# Patient Record
Sex: Male | Born: 1955 | Race: White | Hispanic: No | Marital: Married | Smoking: Never smoker
Health system: Southern US, Community
[De-identification: ages and names within clinical notes are randomized; demographics above are authoritative.]

## PROBLEM LIST (undated history)

## (undated) DIAGNOSIS — I10 Essential (primary) hypertension: Secondary | ICD-10-CM

---

## 2012-12-10 DIAGNOSIS — I10 Essential (primary) hypertension: Secondary | ICD-10-CM | POA: Insufficient documentation

## 2015-04-20 DIAGNOSIS — S83411A Sprain of medial collateral ligament of right knee, initial encounter: Secondary | ICD-10-CM | POA: Insufficient documentation

## 2020-01-12 ENCOUNTER — Ambulatory Visit: Payer: BC Managed Care – PPO | Admitting: Podiatry

## 2020-01-14 ENCOUNTER — Ambulatory Visit
Admission: EM | Admit: 2020-01-14 | Discharge: 2020-01-14 | Disposition: A | Discharge status: Home / Self Care | Payer: BC Managed Care – PPO | Attending: Internal Medicine | Admitting: Internal Medicine

## 2020-01-14 ENCOUNTER — Encounter: Payer: Self-pay | Admitting: Emergency Medicine

## 2020-01-14 ENCOUNTER — Ambulatory Visit (INDEPENDENT_AMBULATORY_CARE_PROVIDER_SITE_OTHER)
Admit: 2020-01-14 | Discharge: 2020-01-14 | Disposition: A | Discharge status: Home / Self Care | Payer: BC Managed Care – PPO | Attending: Internal Medicine | Admitting: Internal Medicine

## 2020-01-14 ENCOUNTER — Other Ambulatory Visit: Payer: Self-pay

## 2020-01-14 ENCOUNTER — Ambulatory Visit (INDEPENDENT_AMBULATORY_CARE_PROVIDER_SITE_OTHER): Payer: BC Managed Care – PPO

## 2020-01-14 DIAGNOSIS — K5730 Diverticulosis of large intestine without perforation or abscess without bleeding: Secondary | ICD-10-CM | POA: Diagnosis not present

## 2020-01-14 DIAGNOSIS — Z79899 Other long term (current) drug therapy: Secondary | ICD-10-CM | POA: Diagnosis not present

## 2020-01-14 DIAGNOSIS — Z7982 Long term (current) use of aspirin: Secondary | ICD-10-CM | POA: Insufficient documentation

## 2020-01-14 DIAGNOSIS — I10 Essential (primary) hypertension: Secondary | ICD-10-CM | POA: Diagnosis not present

## 2020-01-14 DIAGNOSIS — K5900 Constipation, unspecified: Secondary | ICD-10-CM | POA: Diagnosis not present

## 2020-01-14 DIAGNOSIS — I700 Atherosclerosis of aorta: Secondary | ICD-10-CM | POA: Insufficient documentation

## 2020-01-14 DIAGNOSIS — K56609 Unspecified intestinal obstruction, unspecified as to partial versus complete obstruction: Secondary | ICD-10-CM | POA: Diagnosis not present

## 2020-01-14 DIAGNOSIS — R109 Unspecified abdominal pain: Secondary | ICD-10-CM | POA: Diagnosis not present

## 2020-01-14 DIAGNOSIS — K5909 Other constipation: Secondary | ICD-10-CM | POA: Diagnosis present

## 2020-01-14 HISTORY — DX: Essential (primary) hypertension: I10

## 2020-01-14 LAB — CBC WITH DIFFERENTIAL/PLATELET
Abs Immature Granulocytes: 0.06 10*3/uL (ref 0.00–0.07)
Basophils Absolute: 0.1 10*3/uL (ref 0.0–0.1)
Basophils Relative: 1 %
Eosinophils Absolute: 0.1 10*3/uL (ref 0.0–0.5)
Eosinophils Relative: 1 %
HCT: 40 % (ref 39.0–52.0)
Hemoglobin: 13.7 g/dL (ref 13.0–17.0)
Immature Granulocytes: 1 %
Lymphocytes Relative: 16 %
Lymphs Abs: 1.9 10*3/uL (ref 0.7–4.0)
MCH: 30.4 pg (ref 26.0–34.0)
MCHC: 34.3 g/dL (ref 30.0–36.0)
MCV: 88.7 fL (ref 80.0–100.0)
Monocytes Absolute: 0.5 10*3/uL (ref 0.1–1.0)
Monocytes Relative: 4 %
Neutro Abs: 9 10*3/uL — ABNORMAL HIGH (ref 1.7–7.7)
Neutrophils Relative %: 77 %
Platelets: 303 10*3/uL (ref 150–400)
RBC: 4.51 MIL/uL (ref 4.22–5.81)
RDW: 12 % (ref 11.5–15.5)
WBC: 11.6 10*3/uL — ABNORMAL HIGH (ref 4.0–10.5)
nRBC: 0 % (ref 0.0–0.2)

## 2020-01-14 LAB — AMYLASE: Amylase: 28 U/L (ref 28–100)

## 2020-01-14 LAB — COMPREHENSIVE METABOLIC PANEL
ALT: 40 U/L (ref 0–44)
AST: 29 U/L (ref 15–41)
Albumin: 5 g/dL (ref 3.5–5.0)
Alkaline Phosphatase: 52 U/L (ref 38–126)
Anion gap: 11 (ref 5–15)
BUN: 35 mg/dL — ABNORMAL HIGH (ref 8–23)
CO2: 28 mmol/L (ref 22–32)
Calcium: 10.1 mg/dL (ref 8.9–10.3)
Chloride: 99 mmol/L (ref 98–111)
Creatinine, Ser: 1.32 mg/dL — ABNORMAL HIGH (ref 0.61–1.24)
GFR calc Af Amer: >60 mL/min (ref >60)
GFR calc non Af Amer: 57 mL/min — ABNORMAL LOW (ref >60)
Glucose, Bld: 107 mg/dL — ABNORMAL HIGH (ref 70–99)
Potassium: 4.1 mmol/L (ref 3.5–5.1)
Sodium: 138 mmol/L (ref 135–145)
Total Bilirubin: 1.6 mg/dL — ABNORMAL HIGH (ref 0.3–1.2)
Total Protein: 7.7 g/dL (ref 6.5–8.1)

## 2020-01-14 LAB — LIPASE, BLOOD: Lipase: 32 U/L (ref 11–51)

## 2020-01-14 NOTE — ED Provider Notes (Signed)
[facility]    CSN: [id number] Arrival date & time: 01/14/20  1727      History   Chief Complaint Chief Complaint  Patient presents with  . Abdominal Pain  . Constipation    HPI Cole Harrison is a 64 y.o. male who presents with chronic constipation x 1 y. He took Miralax yesterday and did not have a BM. Today he took some Metamucil and still did not have a BM. Today his wife made him a sandwich and as he started eating felt fool and immediately started having epigastric pain and got sweaty and felt burping and continues burping as if he just finished eating his sandwich, but is not feeling anything bitter coming up his throat. He took 5 TUMS with no relief. Finally he has small BM this afternoon. Denies blood in his stool or black stool   Past Medical History:  Diagnosis Date  . Hypertension     Patient Active Problem List   Diagnosis Date Noted  . Sprain of medial collateral ligament of right knee 04/20/2015  . HTN (hypertension) 12/10/2012    History reviewed. No pertinent surgical history.     Home Medications    Prior to Admission medications   Medication Sig Start Date End Date Taking? Authorizing Provider  aspirin 81 MG EC tablet Take by mouth.   Yes [provider]  hydrochlorothiazide (HYDRODIURIL) 12.5 MG tablet Take 1 tablet by mouth every morning. 05/28/19  Yes [provider]  omeprazole (PRILOSEC) 40 MG capsule Take by mouth. 06/02/18 01/14/20  [provider]    Family History Family History  Problem Relation Age of Onset  . Stroke Mother   . Cancer Mother   . Cancer Father     Social History Social History   Tobacco Use  . Smoking status: Never Smoker  . Smokeless tobacco: Never Used  Vaping Use  . Vaping Use: Never used  Substance Use Topics  . Alcohol use: Never  . Drug use: Never     Allergies   Patient has no known allergies.   Review of Systems Review of Systems   Physical Exam Triage  Vital Signs ED Triage Vitals  Enc Vitals Group     BP 01/14/20 1759 (!) 166/91     Pulse Rate 01/14/20 1759 62     Resp 01/14/20 1759 18     Temp 01/14/20 1759 98 F (36.7 C)     Temp Source 01/14/20 1759 Oral     SpO2 01/14/20 1759 98 %     Weight 01/14/20 1757 180 lb (81.6 kg)     Height 01/14/20 1757 5\' 6"  (1.676 m)     Head Circumference --      Peak Flow --      Pain Score 01/14/20 1757 5     Pain Loc --      Pain Edu? --      Excl. in GC? --    No data found.  Updated Vital Signs BP (!) 166/91 (BP Location: Right Arm)   Pulse 62   Temp 98 F (36.7 C) (Oral)   Resp 18   Ht 5\' 6"  (1.676 m)   Wt 180 lb (81.6 kg)   SpO2 98%   BMI 29.05 kg/m   Visual Acuity Right Eye Distance:   Left Eye Distance:   Bilateral Distance:    Right Eye Near:   Left Eye Near:    Bilateral Near:     Physical Exam Vitals  and nursing note reviewed.  Constitutional:      General: He is not in acute distress.    Appearance: He is not toxic-appearing.  HENT:     Head: Normocephalic.  Eyes:     General: No scleral icterus.    Extraocular Movements: Extraocular movements intact.  Cardiovascular:     Rate and Rhythm: Normal rate and regular rhythm.     Heart sounds: No murmur heard.   Pulmonary:     Effort: Pulmonary effort is normal.     Breath sounds: Normal breath sounds.  Abdominal:     General: Abdomen is flat. Bowel sounds are decreased. There is no distension or abdominal bruit.     Palpations: Abdomen is soft. There is no hepatomegaly, splenomegaly or mass.     Tenderness: There is abdominal tenderness in the epigastric area. There is no guarding or rebound.  Skin:    General: Skin is warm and dry.     Coloration: Skin is not jaundiced.     Findings: No rash.  Neurological:     General: No focal deficit present.     Mental Status: He is alert and oriented to person, place, and time.  Psychiatric:        Mood and Affect: Mood normal.        Behavior: Behavior normal.        UC Treatments / Results  Labs (all labs ordered are listed, but only abnormal results are displayed) Labs Reviewed  CBC WITH DIFFERENTIAL/PLATELET - Abnormal; Notable for the following components:      Result Value   WBC 11.6 (*)    Neutro Abs 9.0 (*)    All other components within normal limits  COMPREHENSIVE METABOLIC PANEL - Abnormal; Notable for the following components:   Glucose, Bld 107 (*)    BUN 35 (*)    Creatinine, Ser 1.32 (*)    Total Bilirubin 1.6 (*)    GFR calc non Af Amer 57 (*)    All other components within normal limits  AMYLASE  LIPASE, BLOOD    EKG   Radiology CT ABDOMEN PELVIS WO CONTRAST  Result Date: 01/14/2020 CLINICAL DATA:  Epigastric pain. Leukocytosis. Acute abdominal pain, onset after eating a sandwich. EXAM: CT ABDOMEN AND PELVIS WITHOUT CONTRAST TECHNIQUE: Multidetector CT imaging of the abdomen and pelvis was performed following the standard protocol without IV contrast. COMPARISON:  Radiograph earlier today. FINDINGS: Lower chest: No acute abnormality. No acute airspace disease or pleural effusion. Hepatobiliary: No focal hepatic abnormality on noncontrast exam. Decompressed gallbladder. No calcified gallstone. No pericholecystic inflammation. No evidence of biliary dilatation. Pancreas: No ductal dilatation or inflammation. Spleen: Normal in size without focal abnormality. Adrenals/Urinary Tract: Normal adrenal glands. No hydronephrosis. There is mild symmetric ill-defined perinephric edema. Water density lesion in the upper left kidney measures 18 mm and is likely cyst, but incompletely characterized on noncontrast exam. No renal calculi. Decompressed ureters without ureteral calculi. Mild bladder distention without wall thickening. Stomach/Bowel: Fluid distended stomach. Normal positioning of the duodenum and ligament of Treitz. Dilated fluid-filled proximal small bowel with fecalization of small bowel contents in the central abdomen.  Transition from dilated to nondilated in the left mid abdomen, series 5, image 45 and series 2, image 44. There is no evidence of obstructing mass or cause for obstruction. More distal small bowel is decompressed. The appendix is normal. Moderate colonic stool burden. No colonic wall thickening. Diverticulosis involving the distal descending and sigmoid colon. No diverticulitis. Vascular/Lymphatic: Normal  caliber abdominal aorta with minimal aortic atherosclerosis. Scattered retroperitoneal lymph nodes not enlarged by size criteria. There is no mesenteric adenopathy. No upper abdominal adenopathy. Reproductive: Prostatic calcifications. Other: Minimal mesenteric edema involving the fluid-filled small bowel in the upper abdomen. No significant ascites. There is no free air or perforation. No abscess. Tiny fat containing umbilical hernia. Musculoskeletal: There are no acute or suspicious osseous abnormalities. There is facet hypertrophy in the lumbar spine. IMPRESSION: 1. Small bowel obstruction with transition point in the left mid abdomen. No evidence of obstructing mass or cause for obstruction. 2. Colonic diverticulosis without diverticulitis. Aortic Atherosclerosis (ICD10-I70.0). Electronically Signed   By: Narda Rutherford M.D.   On: 01/14/2020 19:42   DG Abd 2 Views  Result Date: 01/14/2020 CLINICAL DATA:  Constipation and abdominal pain EXAM: ABDOMEN - 2 VIEW COMPARISON:  None. FINDINGS: Lung bases are clear. No free air beneath the diaphragm. Suspicion of fluid-filled dilated small bowel in the left abdomen measuring up to 4 cm. Moderate stool in the right and transverse colon. Paucity of distal bowel gas. IMPRESSION: Suspicion for fluid-filled dilated small bowel in the left abdomen, raising concern for a bowel obstruction. Further evaluation with CT is recommended. There is moderate stool in the colon. Electronically Signed   By: Jasmine Pang M.D.   On: 01/14/2020 18:46    Procedures Procedures  (including critical care time)  Medications Ordered in UC Medications - No data to display  Initial Impression / Assessment and Plan / UC Course  I have reviewed the triage vital signs and the nursing notes. His CBC is silty elevated, CMP shows slight elevation of creatinine, mild elevation of glucose, BUN and total bili, Amylase and lipase are pending.  He was sent to [facility] ER.  Pertinent labs & imaging results that were available during my care of the patient were reviewed by me and considered in my medical decision making (see chart for details).  Final Clinical Impressions(s) / UC Diagnoses   Final diagnoses:  Small bowel obstruction [facility])     Discharge Instructions     The cat scan is showing the following and you need to go to the ER: IMPRESSION: 1. Small bowel obstruction with transition point in the left mid abdomen. No evidence of obstructing mass or cause for obstruction. 2. Colonic diverticulosis without diverticulitis.    ED Prescriptions    None     PDMP not reviewed this encounter.   Garey Ham, PA-C 01/14/20 2248

## 2020-01-14 NOTE — Discharge Instructions (Addendum)
The cat scan is showing the following and you need to go to the ER: IMPRESSION: 1. Small bowel obstruction with transition point in the left mid abdomen. No evidence of obstructing mass or cause for obstruction. 2. Colonic diverticulosis without diverticulitis.

## 2020-01-14 NOTE — ED Triage Notes (Signed)
Patient states he has been constipated and has had ongoing problems for about a year. He states he took some Miralax yesterday and had no results. He took some Metamucil today with no relief. He stated he started to eat a sandwich today and felt full immediately and started having reflux. He took 5 TUMS with no relief. He is c/o abdominal pain. Last BM was this afternoon. He reports it was small.

## 2020-01-18 ENCOUNTER — Telehealth: Payer: Self-pay | Admitting: Emergency Medicine

## 2020-01-18 NOTE — Telephone Encounter (Signed)
Authorization for CT done on 01/14/20 was authorized. Berkley Harvey #[id number]

## 2021-12-26 IMAGING — CT CT ABD-PELV W/O CM
1 of 2 series · 14 of 32 positions shown, 18 images · non-contrast
Comparison: Radiograph earlier today.

CLINICAL DATA: Epigastric pain. Leukocytosis. Acute abdominal pain,
onset after eating a sandwich.

EXAM:
CT ABDOMEN AND PELVIS WITHOUT CONTRAST
TECHNIQUE: Multidetector CT imaging of the abdomen and pelvis was performed
following the standard protocol without IV contrast.

[Series 2: axial st · axial · 0.80mm/px · z∈[-890,-470]mm · 14 of 92 slices shown, 18 images]
[im 4/92  soft-tissue]
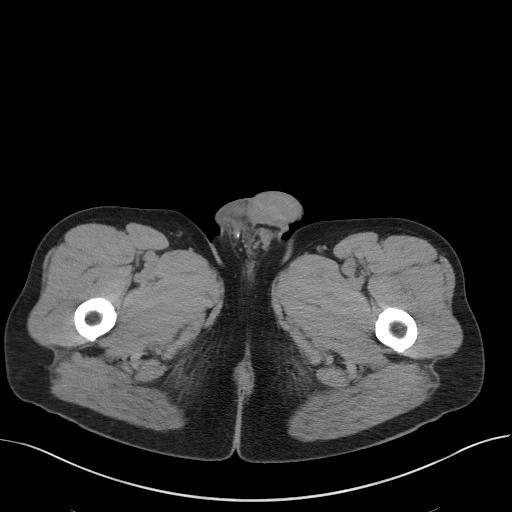
[im 4/92  bone]
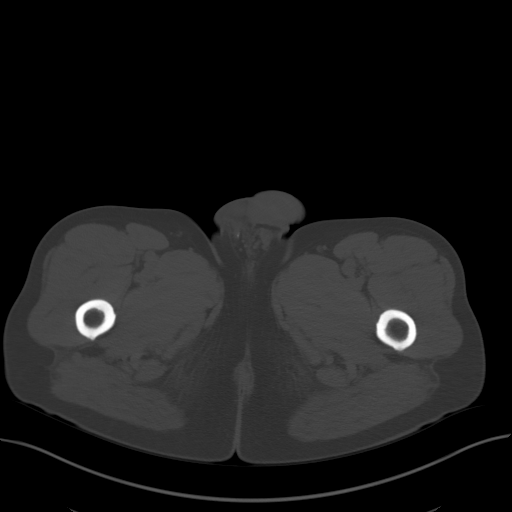
[im 12/92  soft-tissue]
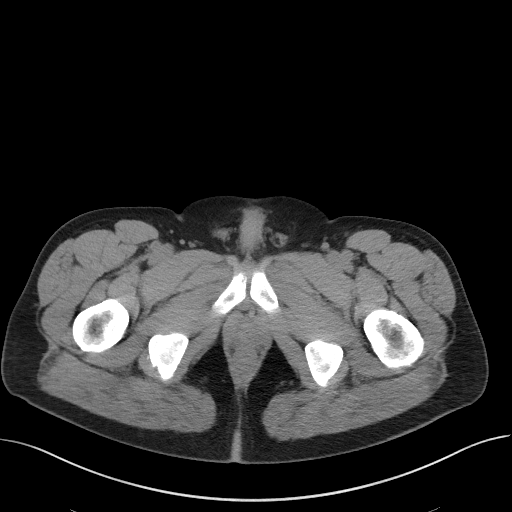
[im 19/92  soft-tissue]
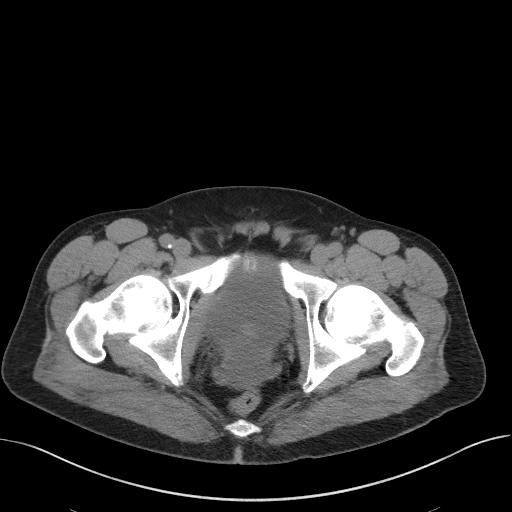
[im 27/92  soft-tissue]
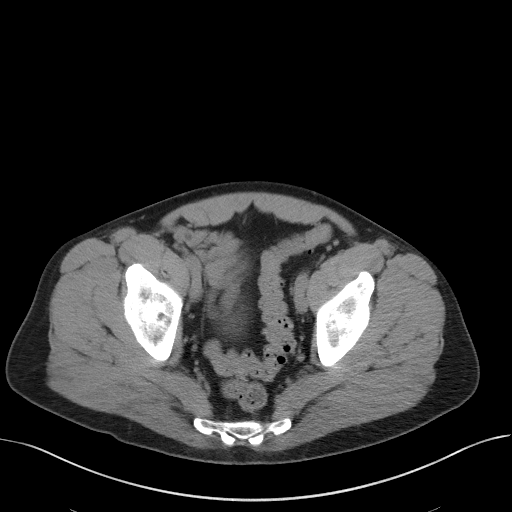
[im 35/92  soft-tissue]
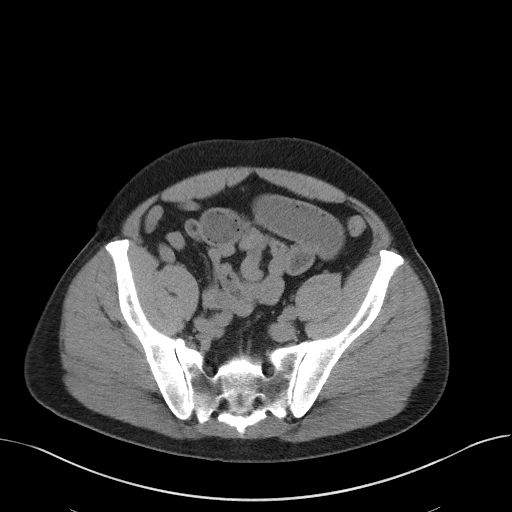
[im 42/92  soft-tissue]
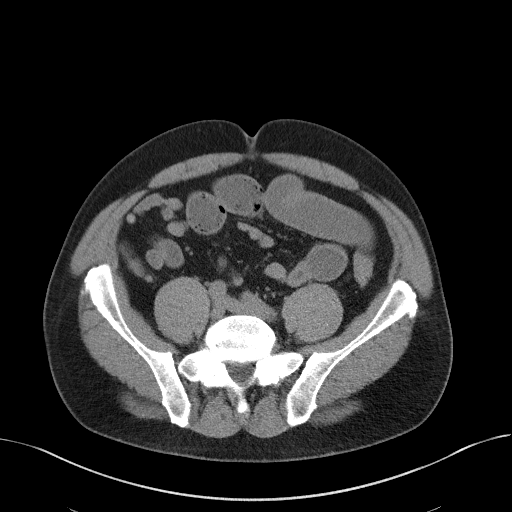
[im 50/92  soft-tissue]
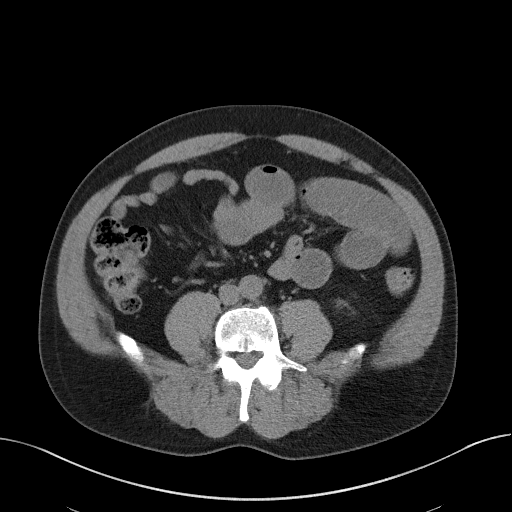
[im 57/92  soft-tissue]
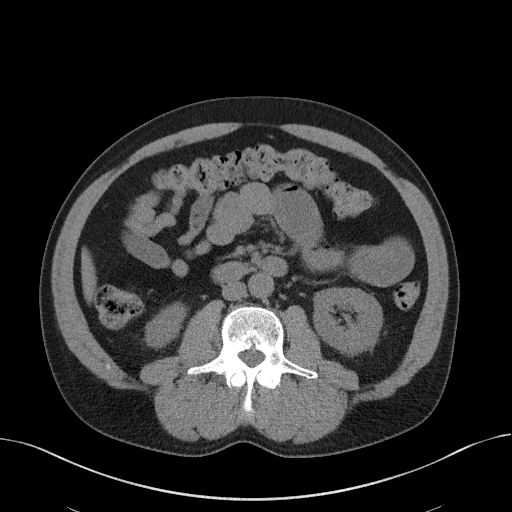
[im 65/92  soft-tissue]
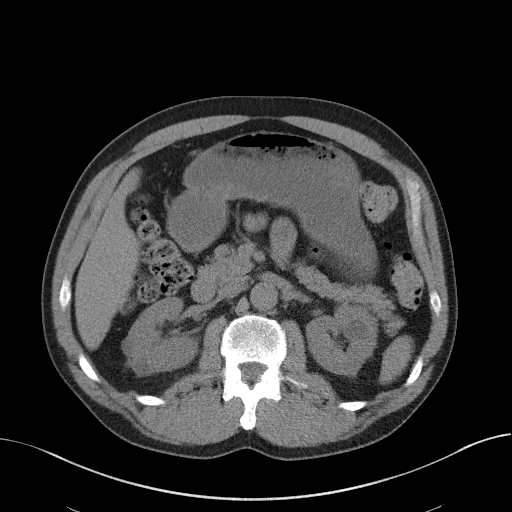
[im 65/92  bone]
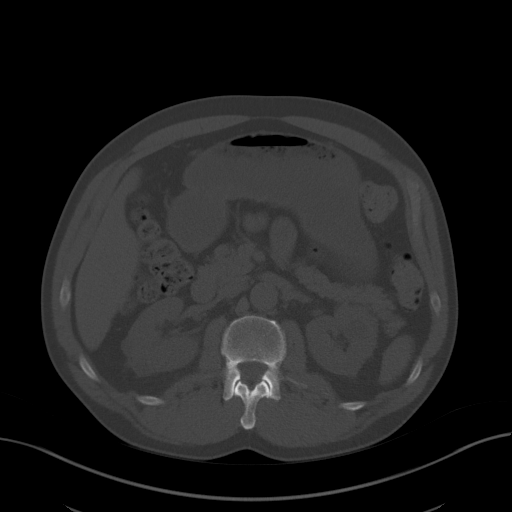
[im 73/92  soft-tissue]
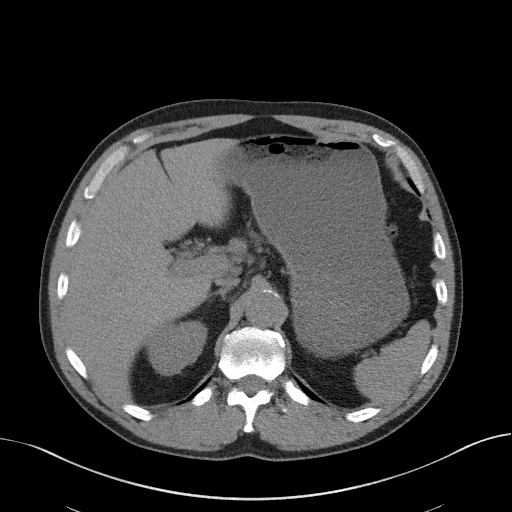
[im 76/92  lung]
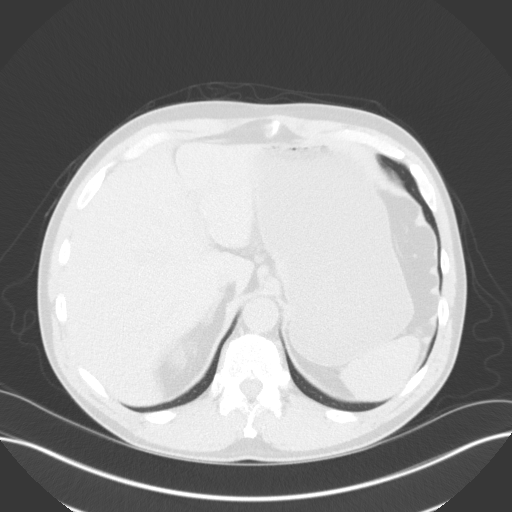
[im 80/92  soft-tissue]
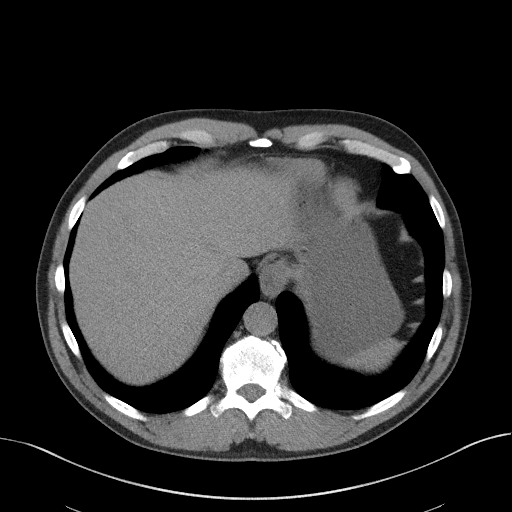
[im 80/92  lung]
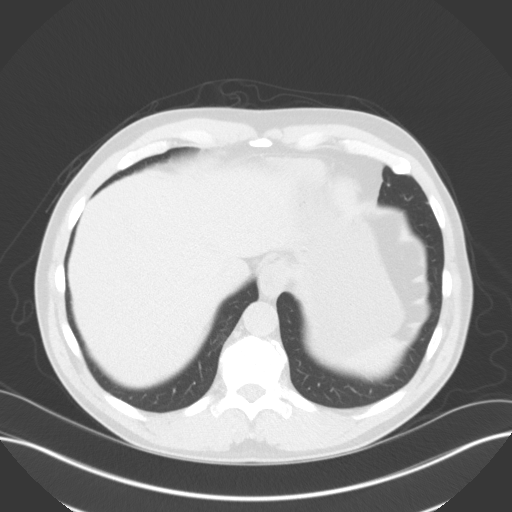
[im 84/92  lung]
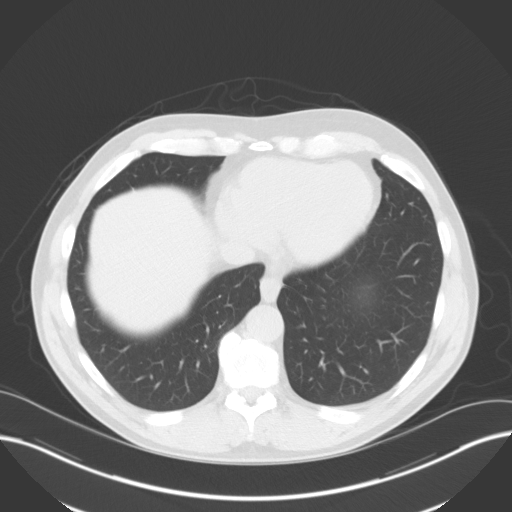
[im 88/92  soft-tissue]
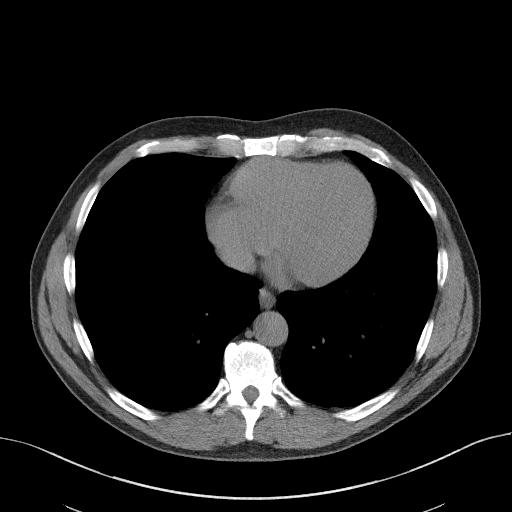
[im 88/92  lung]
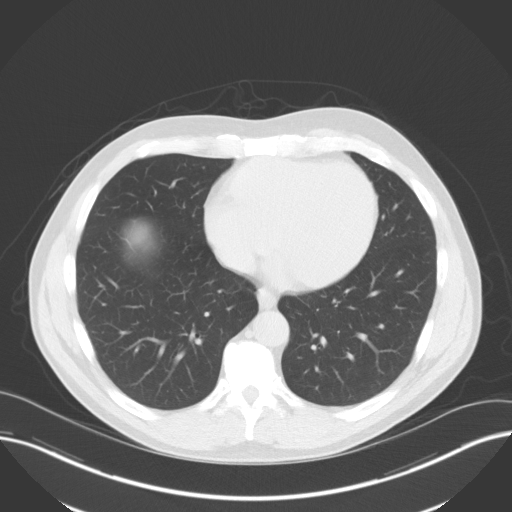

[14 of 32 positions shown; findings below may reference images not displayed]

FINDINGS: Lower chest: No acute abnormality. No acute airspace disease or
pleural effusion.

Hepatobiliary: No focal hepatic abnormality on noncontrast exam.
Decompressed gallbladder. No calcified gallstone. No pericholecystic
inflammation. No evidence of biliary dilatation.

Pancreas: No ductal dilatation or inflammation.

Spleen: Normal in size without focal abnormality.

Adrenals/Urinary Tract: Normal adrenal glands. No hydronephrosis.
There is mild symmetric ill-defined perinephric edema. Water density
lesion in the upper left kidney measures 18 mm and is likely cyst,
but incompletely characterized on noncontrast exam. No renal
calculi. Decompressed ureters without ureteral calculi. Mild bladder
distention without wall thickening.

Stomach/Bowel: Fluid distended stomach. Normal positioning of the
duodenum and ligament of Treitz. Dilated fluid-filled proximal small
bowel with fecalization of small bowel contents in the central
abdomen. Transition from dilated to nondilated in the left mid
abdomen, series 5, image 45 and series 2, image 44. There is no
evidence of obstructing mass or cause for obstruction. More distal
small bowel is decompressed. The appendix is normal. Moderate
colonic stool burden. No colonic wall thickening. Diverticulosis
involving the distal descending and sigmoid colon. No
diverticulitis.

Vascular/Lymphatic: Normal caliber abdominal aorta with minimal
aortic atherosclerosis. Scattered retroperitoneal lymph nodes not
enlarged by size criteria. There is no mesenteric adenopathy. No
upper abdominal adenopathy.

Reproductive: Prostatic calcifications.

Other: Minimal mesenteric edema involving the fluid-filled small
bowel in the upper abdomen. No significant ascites. There is no free
air or perforation. No abscess. Tiny fat containing umbilical
hernia.

Musculoskeletal: There are no acute or suspicious osseous
abnormalities. There is facet hypertrophy in the lumbar spine.
IMPRESSION: 1. Small bowel obstruction with transition point in the left mid
abdomen. No evidence of obstructing mass or cause for obstruction.
2. Colonic diverticulosis without diverticulitis.

Aortic Atherosclerosis ([id number]-TN7.7).

## 2023-11-27 ENCOUNTER — Other Ambulatory Visit: Payer: Self-pay | Admitting: Gastroenterology

## 2023-11-27 DIAGNOSIS — R1011 Right upper quadrant pain: Secondary | ICD-10-CM

## 2023-11-28 ENCOUNTER — Other Ambulatory Visit: Payer: Self-pay

## 2023-12-02 ENCOUNTER — Ambulatory Visit
Admission: RE | Admit: 2023-12-02 | Discharge: 2023-12-02 | Disposition: A | Discharge status: Home / Self Care | Source: Ambulatory Visit | Attending: Gastroenterology | Admitting: Gastroenterology

## 2023-12-02 DIAGNOSIS — R1011 Right upper quadrant pain: Secondary | ICD-10-CM | POA: Insufficient documentation

## 2023-12-10 ENCOUNTER — Other Ambulatory Visit: Payer: Self-pay | Admitting: Gastroenterology

## 2023-12-10 DIAGNOSIS — G8929 Other chronic pain: Secondary | ICD-10-CM

## 2023-12-25 ENCOUNTER — Encounter
Admission: RE | Admit: 2023-12-25 | Discharge: 2023-12-25 | Disposition: A | Discharge status: Home / Self Care | Source: Ambulatory Visit | Attending: Gastroenterology | Admitting: Gastroenterology

## 2023-12-25 DIAGNOSIS — R1011 Right upper quadrant pain: Secondary | ICD-10-CM | POA: Diagnosis present

## 2023-12-25 DIAGNOSIS — G8929 Other chronic pain: Secondary | ICD-10-CM | POA: Diagnosis present

## 2023-12-25 MED ORDER — TECHNETIUM TC 99M MEBROFENIN IV KIT
5.0000 | PACK | Freq: Once | INTRAVENOUS | Status: AC | PRN
  Administered 2023-12-25: 5.51 via INTRAVENOUS

## 2023-12-31 ENCOUNTER — Other Ambulatory Visit

## 2024-05-27 ENCOUNTER — Other Ambulatory Visit: Payer: Self-pay | Admitting: Surgery

## 2024-05-27 DIAGNOSIS — K824 Cholesterolosis of gallbladder: Secondary | ICD-10-CM

## 2024-06-05 ENCOUNTER — Ambulatory Visit

## 2024-06-16 ENCOUNTER — Encounter
